# Patient Record
Sex: Male | Born: 1985 | Race: White | Hispanic: No | Marital: Single | Smoking: Never smoker
Health system: Southern US, Community
[De-identification: ages and names within clinical notes are randomized; demographics above are authoritative.]

## PROBLEM LIST (undated history)

## (undated) HISTORY — PX: KNEE SURGERY: SHX244

---
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
## 2005-01-23 ENCOUNTER — Emergency Department (HOSPITAL_COMMUNITY): Admission: EM | Admit: 2005-01-23 | Discharge: 2005-01-23 | Payer: Self-pay | Admitting: Emergency Medicine

## 2007-07-12 ENCOUNTER — Emergency Department (HOSPITAL_COMMUNITY): Admission: EM | Admit: 2007-07-12 | Discharge: 2007-07-12 | Payer: Self-pay | Admitting: Emergency Medicine

## 2011-01-21 LAB — I-STAT 8, (EC8 V) (CONVERTED LAB)
Acid-base deficit: 2
Chloride: 106
HCT: 46
Potassium: 3.6
pH, Ven: 7.361 — ABNORMAL HIGH

## 2011-01-21 LAB — CBC
HCT: 42.3
Hemoglobin: 14.5
MCV: 91.6
RBC: 4.62
WBC: 9.1

## 2011-01-21 LAB — DIFFERENTIAL
Eosinophils Absolute: 0.1
Eosinophils Relative: 1
Lymphs Abs: 2.7
Monocytes Absolute: 0.6
Monocytes Relative: 6
Neutrophils Relative %: 63

## 2011-01-21 LAB — POCT I-STAT CREATININE: Creatinine, Ser: 1.2

## 2016-11-12 ENCOUNTER — Ambulatory Visit (INDEPENDENT_AMBULATORY_CARE_PROVIDER_SITE_OTHER): Payer: Self-pay | Admitting: Physician Assistant

## 2016-11-12 ENCOUNTER — Encounter: Payer: Self-pay | Admitting: Physician Assistant

## 2016-11-12 VITALS — BP 138/81 | HR 67 | Temp 98.7°F | Resp 16 | Ht 73.5 in | Wt 258.0 lb

## 2016-11-12 DIAGNOSIS — M25521 Pain in right elbow: Secondary | ICD-10-CM

## 2016-11-12 DIAGNOSIS — R200 Anesthesia of skin: Secondary | ICD-10-CM

## 2016-11-12 DIAGNOSIS — R202 Paresthesia of skin: Secondary | ICD-10-CM

## 2016-11-12 DIAGNOSIS — M542 Cervicalgia: Secondary | ICD-10-CM

## 2016-11-12 MED ORDER — PREDNISONE 20 MG PO TABS: ORAL_TABLET | ORAL | 0 refills | Status: DC

## 2016-11-12 MED ORDER — CYCLOBENZAPRINE HCL 10 MG PO TABS: 10.0000 mg | ORAL_TABLET | Freq: Three times a day (TID) | ORAL | 0 refills | Status: AC | PRN

## 2016-11-12 MED ORDER — MELOXICAM 15 MG PO TABS: 15.0000 mg | ORAL_TABLET | Freq: Every day | ORAL | 1 refills | Status: AC
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
## 2016-11-12 NOTE — Patient Instructions (Addendum)
Con't using ice and heat as needed for pain.  Stay well hydrated.  Stretch your neck and get massages to relax your muscles.  Come back in 3-4 weeks if you are not better.   Thank you for coming in today. I hope you feel we met your needs.  Feel free to call [facility] if you have any questions or further requests.  Please consider signing up for MyChart if you do not already have it, as this is a great way to communicate with me.  Best,  Whitney McVey, PA-C   IF you received an x-ray today, you will receive an invoice from [facility]. Please contact [facility] at [phone] with questions or concerns regarding your invoice.   IF you received labwork today, you will receive an invoice from [place]. Please contact [facility] at [phone] with questions or concerns regarding your invoice.   Our billing staff will not be able to assist you with questions regarding bills from these companies.  You will be contacted with the lab results as soon as they are available. The fastest way to get your results is to activate your My Chart account. Instructions are located on the last page of this paperwork. If you have not heard from [place] regarding the results in 2 weeks, please contact this office.

## 2017-03-18 DIAGNOSIS — Z0000 Encounter for general adult medical examination without abnormal findings: Secondary | ICD-10-CM | POA: Diagnosis not present

## 2017-04-25 ENCOUNTER — Other Ambulatory Visit: Payer: Self-pay | Admitting: Internal Medicine

## 2017-04-25 DIAGNOSIS — M545 Low back pain, unspecified: Secondary | ICD-10-CM

## 2017-04-25 DIAGNOSIS — M5410 Radiculopathy, site unspecified: Secondary | ICD-10-CM

## 2017-05-01 ENCOUNTER — Ambulatory Visit
Admission: RE | Admit: 2017-05-01 | Discharge: 2017-05-01 | Disposition: A | Discharge status: Home / Self Care | Payer: BLUE CROSS/BLUE SHIELD | Source: Ambulatory Visit | Attending: Internal Medicine | Admitting: Internal Medicine

## 2017-05-01 DIAGNOSIS — M5410 Radiculopathy, site unspecified: Secondary | ICD-10-CM

## 2017-05-01 DIAGNOSIS — M545 Low back pain, unspecified: Secondary | ICD-10-CM

## 2017-05-01 DIAGNOSIS — M48061 Spinal stenosis, lumbar region without neurogenic claudication: Secondary | ICD-10-CM | POA: Diagnosis not present

## 2017-05-01 MED ORDER — GADOBENATE DIMEGLUMINE 529 MG/ML IV SOLN
20.0000 mL | Freq: Once | INTRAVENOUS | Status: AC | PRN
  Administered 2017-05-01: 20 mL via INTRAVENOUS

## 2017-05-05 ENCOUNTER — Other Ambulatory Visit: Payer: Self-pay

## 2017-05-05 DIAGNOSIS — M5416 Radiculopathy, lumbar region: Secondary | ICD-10-CM | POA: Diagnosis not present

## 2017-05-05 DIAGNOSIS — M549 Dorsalgia, unspecified: Secondary | ICD-10-CM | POA: Diagnosis not present

## 2017-05-05 DIAGNOSIS — M47816 Spondylosis without myelopathy or radiculopathy, lumbar region: Secondary | ICD-10-CM | POA: Diagnosis not present

## 2017-05-05 DIAGNOSIS — M546 Pain in thoracic spine: Secondary | ICD-10-CM | POA: Diagnosis not present

## 2017-05-05 DIAGNOSIS — M5126 Other intervertebral disc displacement, lumbar region: Secondary | ICD-10-CM | POA: Diagnosis not present

## 2017-05-05 DIAGNOSIS — M5136 Other intervertebral disc degeneration, lumbar region: Secondary | ICD-10-CM | POA: Diagnosis not present

## 2017-05-22 DIAGNOSIS — M5126 Other intervertebral disc displacement, lumbar region: Secondary | ICD-10-CM | POA: Diagnosis not present

## 2017-05-22 DIAGNOSIS — M5116 Intervertebral disc disorders with radiculopathy, lumbar region: Secondary | ICD-10-CM | POA: Diagnosis not present

## 2017-05-22 DIAGNOSIS — M5136 Other intervertebral disc degeneration, lumbar region: Secondary | ICD-10-CM | POA: Diagnosis not present

## 2017-05-22 DIAGNOSIS — M4726 Other spondylosis with radiculopathy, lumbar region: Secondary | ICD-10-CM | POA: Diagnosis not present

## 2018-05-11 DIAGNOSIS — Z3141 Encounter for fertility testing: Secondary | ICD-10-CM | POA: Diagnosis not present

## 2018-08-09 IMAGING — MR MR LUMBAR SPINE WO/W CM
7 series · 41 of 48 positions shown · IV contrast (20 ml multihance)
Comparison: None available.

CLINICAL DATA: Initial evaluation for low back pain with right
lower extremity pain, weakness, and numbness for 3 weeks.

EXAM:
MRI LUMBAR SPINE WITHOUT AND WITH CONTRAST
TECHNIQUE: Multiplanar and multiecho pulse sequences of the lumbar spine were
obtained without and with intravenous contrast.
CONTRAST:  20mL MULTIHANCE GADOBENATE DIMEGLUMINE 529 MG/ML IV SOLN

[Series 2: STIR · sagittal · 4.0mm · 0.55mm/px · 3 of 12 slices shown]
[im 1/12]
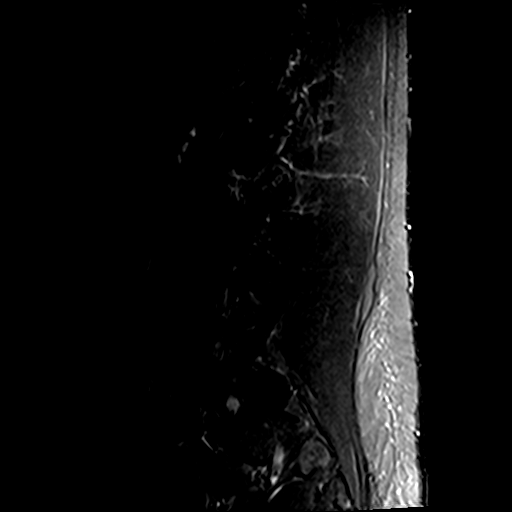
[im 6/12]
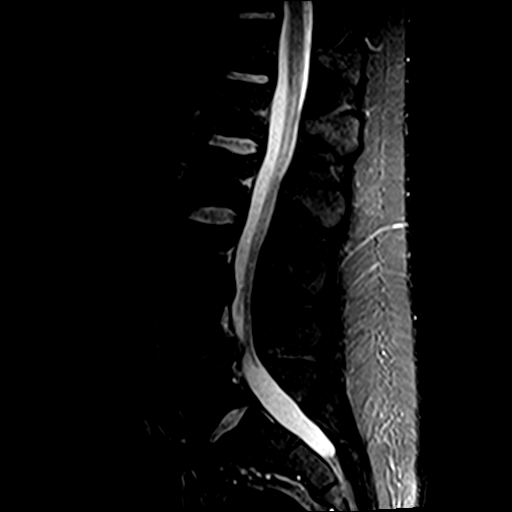
[im 12/12]
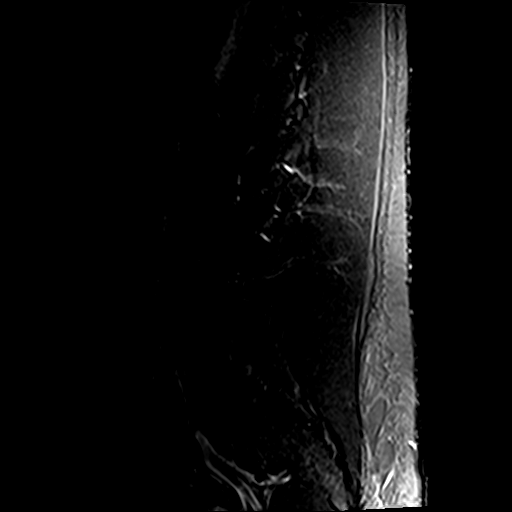

[Series 3: T1 · sagittal · 4.0mm · 0.88mm/px · 4 of 12 slices shown (1 of 2)]
[im 1/12]
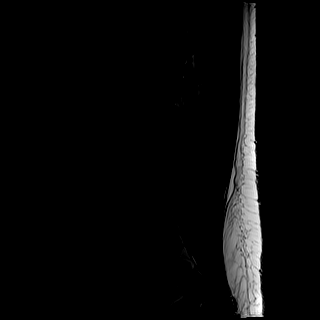
[im 4/12]
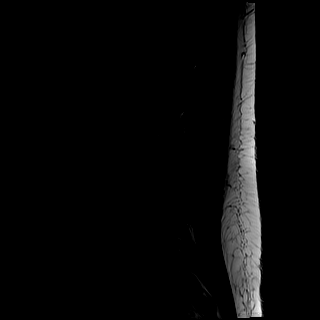
[im 8/12]
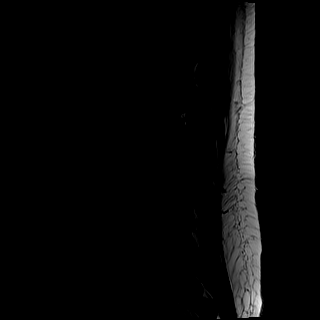
[im 12/12]
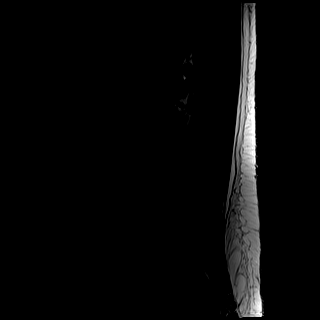

[Series 4: T1 · axial · 4.0mm · 0.78mm/px · z∈[-155,+82]mm · 11 of 33 slices shown (2 of 2)]
[im 1/33]
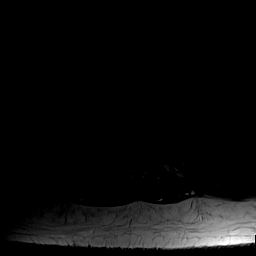
[im 4/33]
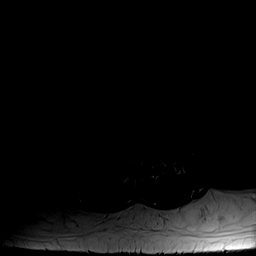
[im 7/33]
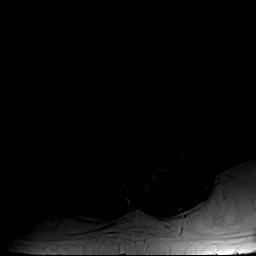
[im 10/33]
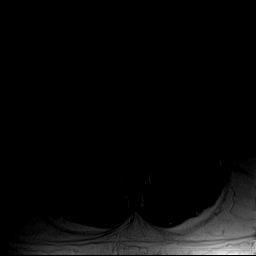
[im 13/33]
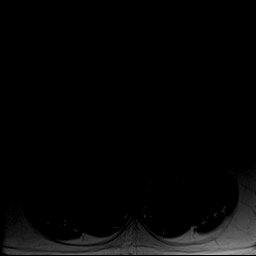
[im 17/33]
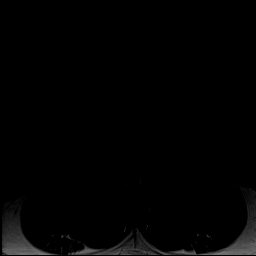
[im 20/33]
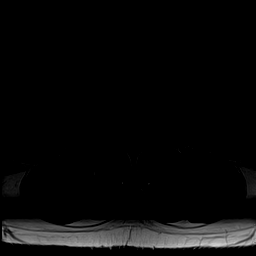
[im 23/33]
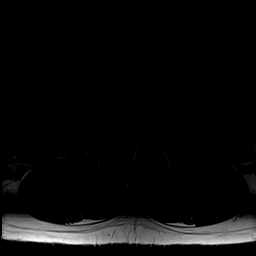
[im 26/33]
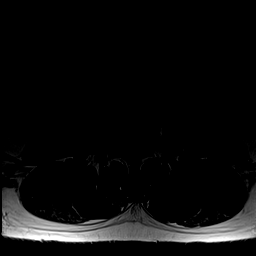
[im 29/33]
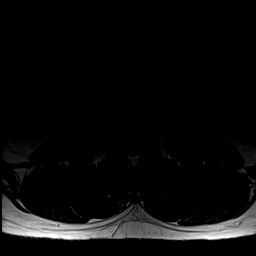
[im 33/33]
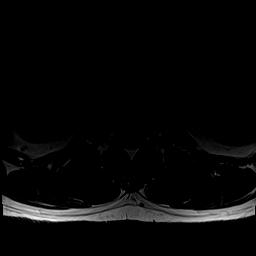

[Series 5: T2 · axial · 4.0mm · 0.78mm/px · z∈[-155,+82]mm · 11 of 33 slices shown]
[im 1/33]
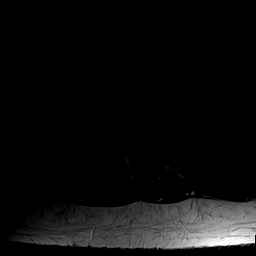
[im 4/33]
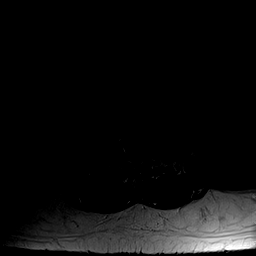
[im 7/33]
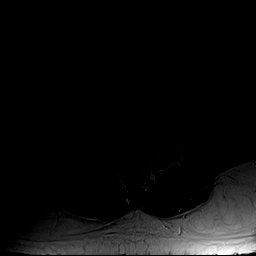
[im 10/33]
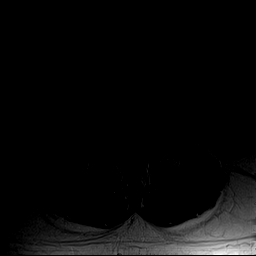
[im 13/33]
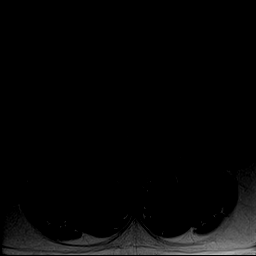
[im 17/33]
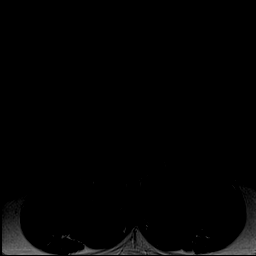
[im 20/33]
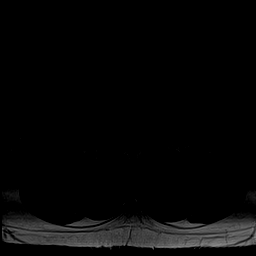
[im 23/33]
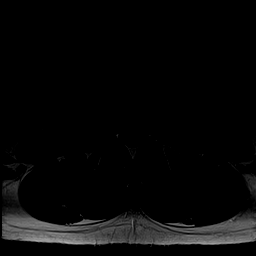
[im 26/33]
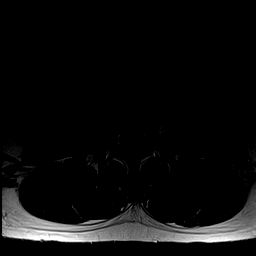
[im 29/33]
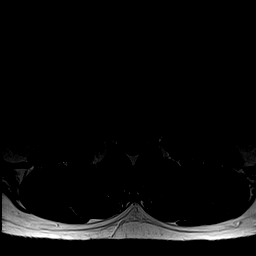
[im 33/33]
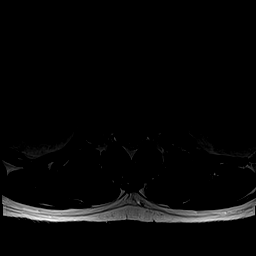

[Series 6: T2 post-contrast · sagittal · 4.0mm · 0.88mm/px · 4 of 12 slices shown]
[im 1/12]
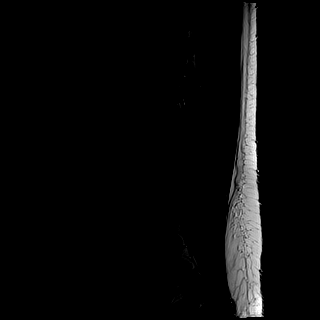
[im 4/12]
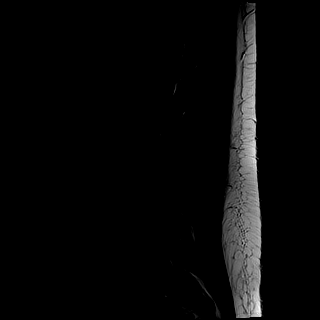
[im 8/12]
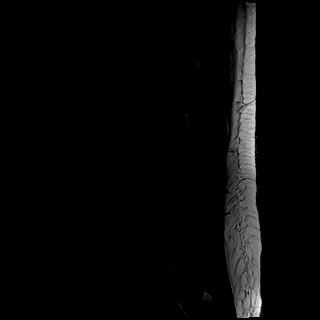
[im 12/12]
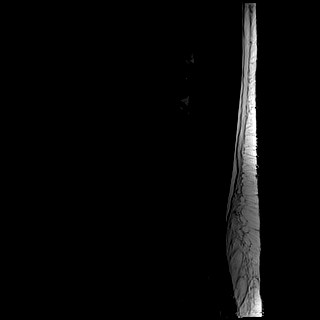

[Series 7: sag post · sagittal · 4.0mm · 0.55mm/px · 4 of 12 slices shown]
[im 1/12]
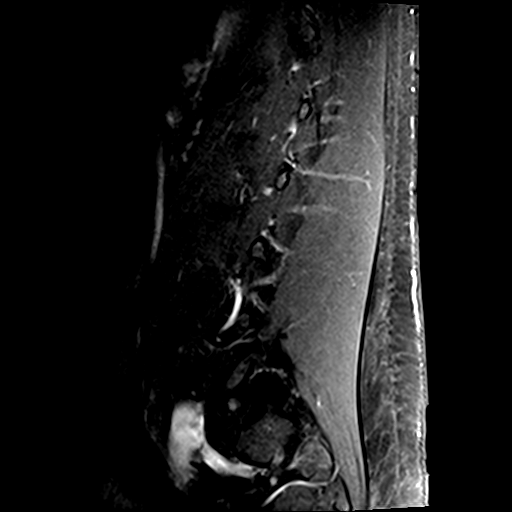
[im 4/12]
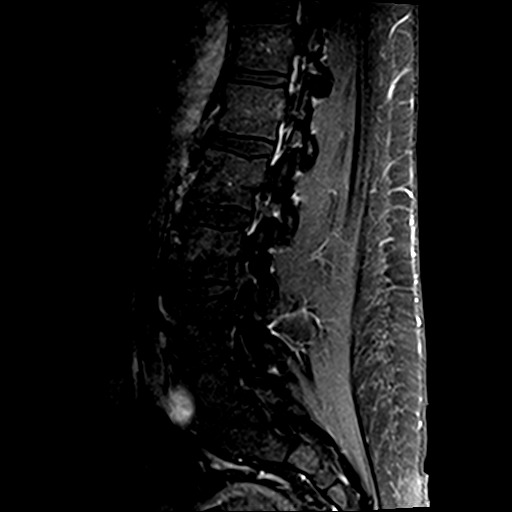
[im 8/12]
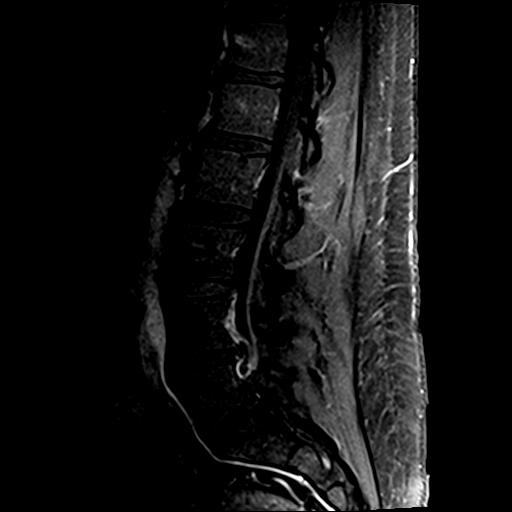
[im 12/12]
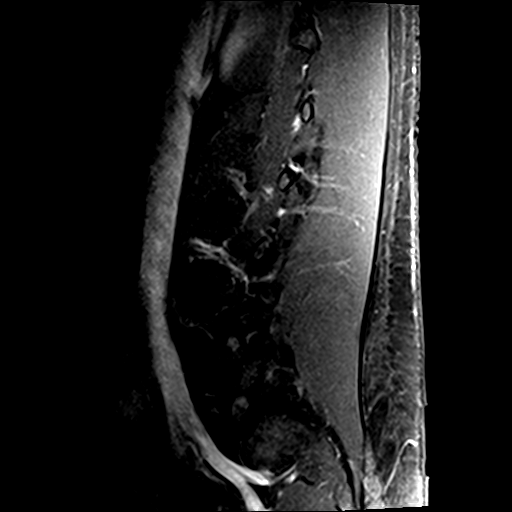

[Series 8: axial post · axial · 4.0mm · 0.78mm/px · z∈[-155,-95]mm · 4 of 33 slices shown]
[im 1/33]
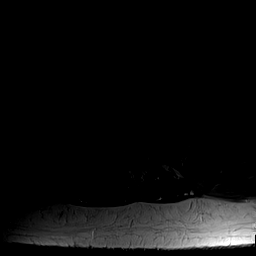
[im 7/33]
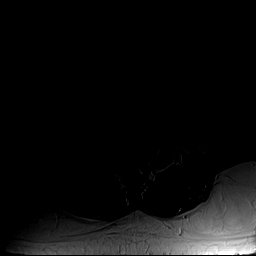
[im 10/33]
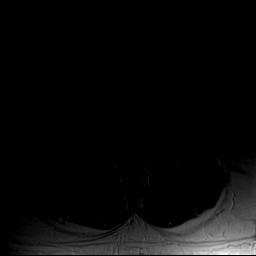
[im 13/33]
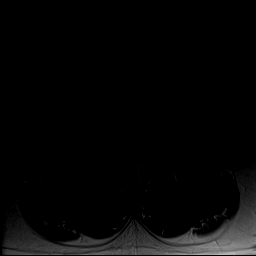

[41 of 48 positions shown; findings below may reference images not displayed]

FINDINGS: Segmentation: Normal segmentation. Lowest well-formed disc labeled
the L5-S1 level.

Alignment: Normal alignment with preservation of the normal lumbar
lordosis.

Vertebrae: Vertebral body heights maintained without acute or
chronic fracture. Bone marrow signal intensity mildly decreased on
T1 weighted imaging, most commonly related to anemia, smoking, or
obesity D. no discrete or worrisome osseous lesions. No abnormal
marrow edema or enhancement.

Conus medullaris and cauda equina: Conus extends to the L1 level.
Conus and cauda equina appear normal.

Paraspinal and other soft tissues: Paraspinous soft tissues within
normal limits. Visualized visceral structures are normal.

Disc levels:

T12-L1:  Unremarkable.

L1-2:  Unremarkable.

L2-3: Normal interspace. Mild bilateral facet hypertrophy. No
stenosis or impingement.

L3-4: Disc bulge with disc desiccation. Superimposed small central
disc protrusion with associated annular fissure. Mild facet
hypertrophy. Mild lateral recess narrowing without neural
compression. Foramina remain patent.

L4-5: Disc bulge with disc desiccation and intervertebral disc space
narrowing. Right subarticular disc extrusion measures approximately
8 x 14 x 19 mm (AP by transverse by craniocaudad). Approximate 1 cm
of inferior migration. Associated peridiscal enhancement.
Impingement of the right L5 nerve root. Associated nerve root
enhancement consistent with irritation related to impingement
(series 8, image 20 on axial sequence, series 7, image 6 on sagittal
sequence. Enhancement extends cephalad to the level of the conus.
Mild facet hypertrophy. Resultant moderate canal stenosis. Foramina
remain patent.

L5-S1: Normal interspace. Mild facet hypertrophy, slightly greater
on the right. Mild right L5 foraminal narrowing.
IMPRESSION: 1. Right subarticular disc extrusion at L4-5 with secondary right L5
nerve root impingement.
2. Central disc protrusion at L3-4 without neural compression.
3. Mild facet hypertrophy at L2-3 through L5-S1.

## 2019-02-12 DIAGNOSIS — Z23 Encounter for immunization: Secondary | ICD-10-CM | POA: Diagnosis not present

## 2019-03-22 DIAGNOSIS — Z0000 Encounter for general adult medical examination without abnormal findings: Secondary | ICD-10-CM | POA: Diagnosis not present

## 2019-03-31 DIAGNOSIS — Z1331 Encounter for screening for depression: Secondary | ICD-10-CM | POA: Diagnosis not present

## 2019-03-31 DIAGNOSIS — Z0000 Encounter for general adult medical examination without abnormal findings: Secondary | ICD-10-CM | POA: Diagnosis not present

## 2019-04-02 DIAGNOSIS — Z23 Encounter for immunization: Secondary | ICD-10-CM | POA: Diagnosis not present

## 2019-04-02 DIAGNOSIS — R7989 Other specified abnormal findings of blood chemistry: Secondary | ICD-10-CM | POA: Diagnosis not present

## 2019-04-26 DIAGNOSIS — M25512 Pain in left shoulder: Secondary | ICD-10-CM | POA: Diagnosis not present

## 2019-05-05 DIAGNOSIS — M25512 Pain in left shoulder: Secondary | ICD-10-CM | POA: Diagnosis not present

## 2019-05-10 DIAGNOSIS — M25512 Pain in left shoulder: Secondary | ICD-10-CM | POA: Diagnosis not present

## 2019-05-10 DIAGNOSIS — M7542 Impingement syndrome of left shoulder: Secondary | ICD-10-CM | POA: Diagnosis not present

## 2019-06-07 DIAGNOSIS — M25512 Pain in left shoulder: Secondary | ICD-10-CM | POA: Diagnosis not present

## 2019-06-07 DIAGNOSIS — M7542 Impingement syndrome of left shoulder: Secondary | ICD-10-CM | POA: Diagnosis not present

## 2019-07-08 ENCOUNTER — Ambulatory Visit: Payer: BC Managed Care – PPO | Attending: Internal Medicine

## 2019-07-08 DIAGNOSIS — Z23 Encounter for immunization: Secondary | ICD-10-CM

## 2019-07-08 NOTE — Progress Notes (Signed)
   Covid-19 Vaccination Clinic  Name:  Alex Hancock    MRN: [id number] DOB: [birth year]  07/08/2019  [name] was observed post Covid-19 immunization for 15 minutes without incident. He was provided with Vaccine Information Sheet and instruction to access the V-Safe system.   [name] was instructed to call 911 with any severe reactions post vaccine: Marland Kitchen Difficulty breathing  . Swelling of face and throat  . A fast heartbeat  . A bad rash all over body  . Dizziness and weakness   Immunizations Administered    Name Date Dose VIS Date Route   Pfizer COVID-19 Vaccine 07/08/2019 12:44 PM 0.3 mL 04/09/2019 Intramuscular   Manufacturer: [employer], [employer]   Lot: [id number]   NDC: [address]-8

## 2019-08-02 ENCOUNTER — Ambulatory Visit: Payer: BC Managed Care – PPO | Attending: Internal Medicine

## 2019-08-02 DIAGNOSIS — Z23 Encounter for immunization: Secondary | ICD-10-CM

## 2019-08-02 NOTE — Progress Notes (Signed)
   Covid-19 Vaccination Clinic  Name:  Alex Hancock    MRN: [id number] DOB: 06-Jun-1985  08/02/2019  Mr. Laymon was observed post Covid-19 immunization for 15 minutes without incident. He was provided with Vaccine Information Sheet and instruction to access the V-Safe system.   Mr. Dudzinski was instructed to call 911 with any severe reactions post vaccine: Marland Kitchen Difficulty breathing  . Swelling of face and throat  . A fast heartbeat  . A bad rash all over body  . Dizziness and weakness   Immunizations Administered    Name Date Dose VIS Date Route   Pfizer COVID-19 Vaccine 08/02/2019 10:17 AM 0.3 mL 04/09/2019 Intramuscular   Manufacturer: [employer], [employer]   Lot: [id number]   NDC: [address]-0

## 2019-10-06 DIAGNOSIS — Z3009 Encounter for other general counseling and advice on contraception: Secondary | ICD-10-CM | POA: Diagnosis not present

## 2019-10-21 DIAGNOSIS — Z302 Encounter for sterilization: Secondary | ICD-10-CM | POA: Diagnosis not present

## 2020-01-09 DIAGNOSIS — Z20822 Contact with and (suspected) exposure to covid-19: Secondary | ICD-10-CM | POA: Diagnosis not present

## 2020-10-31 ENCOUNTER — Encounter: Payer: Self-pay | Admitting: Physician Assistant

## 2020-10-31 ENCOUNTER — Telehealth: Payer: BC Managed Care – PPO | Admitting: Physician Assistant

## 2020-10-31 DIAGNOSIS — U071 COVID-19: Secondary | ICD-10-CM

## 2020-10-31 MED ORDER — MOLNUPIRAVIR EUA 200MG CAPSULE: 4.0000 | ORAL_CAPSULE | Freq: Two times a day (BID) | ORAL | 0 refills | Status: AC

## 2020-10-31 MED ORDER — MOLNUPIRAVIR EUA 200MG CAPSULE: 4.0000 | ORAL_CAPSULE | Freq: Two times a day (BID) | ORAL | 0 refills | Status: DC

## 2020-10-31 NOTE — Patient Instructions (Signed)
Hello Alex Hancock,  You are being placed in the home monitoring program for COVID-19 (commonly known as Coronavirus).  This is because you are suspected to have the virus or are known to have the virus.  If you are unsure which group you fall into call your clinic.    As part of this program, you'll answer a daily questionnaire in the MyChart mobile app. You'll receive a notification through the MyChart app when the questionnaire is available. When you log in to MyChart, you'll see the tasks in your To Do activity.       Clinicians will see any answers that are concerning and take appropriate steps.  If at any point you are having a medical emergency, call 911.  If otherwise concerned call your clinic instead of coming into the clinic or hospital.  To keep from spreading the disease you should: Stay home and limit contact with other people as much as possible.  Wash your hands frequently. Cover your coughs and sneezes with a tissue, and throw used tissues in the trash.   Clean and disinfect frequently touched surfaces and objects.    Take care of yourself by: Staying home Resting Drinking fluids Take fever-reducing medications (Tylenol/Acetaminophen and Ibuprofen)  For more information on the disease go to the Centers for Disease Control and Prevention website    You are being prescribed MOLNUPIRAVIR for COVID-19 infection.   Please call the pharmacy or go through the drive through vs going inside if you are picking up the mediation yourself to prevent further spread. If prescribed to a [facility] affiliated pharmacy, a pharmacist will bring the medication out to your car.   ADMINISTRATION INSTRUCTIONS: Take with or without food. Swallow the tablets whole. Don't chew, crush, or break the medications because it might not work as well  For each dose of the medication, you should be taking FOUR tablets at one time, TWICE a day   Finish your full five-day course of Molnupiravir even if you  feel better before you're done. Stopping this medication too early can make it less effective to prevent severe illness related to COVID19.    Molnupiravir is prescribed for YOU ONLY. Don't share it with others, even if they have similar symptoms as you. This medication might not be right for everyone.   Make sure to take steps to protect yourself and others while you're taking this medication in order to get well soon and to prevent others from getting sick with COVID-19.   **If you are of childbearing potential (any gender) - it is advised to not get pregnant while taking this medication and recommended that condoms are used for male partners the next 3 months after taking the medication out of extreme caution    COMMON SIDE EFFECTS: Diarrhea Nausea  Dizziness    If your COVID-19 symptoms get worse, get medical help right away. Call 911 if you experience symptoms such as worsening cough, trouble breathing, chest pain that doesn't go away, confusion, a hard time staying awake, and pale or blue-colored skin. This medication won't prevent all COVID-19 cases from getting worse.   Can take to lessen severity: Vit C 500mg  twice daily Quercertin 250-500mg  twice daily Zinc 75-100mg  daily Melatonin 3-6 mg at bedtime Vit D3 1000-2000 IU daily Aspirin 81 mg daily with food Optional: Famotidine 20mg  daily Also can add tylenol/ibuprofen as needed for fevers and body aches May add Mucinex or Mucinex DM as needed for cough/congestion  10 Things You Can Do to  Manage Your COVID-19 Symptoms at Home If you have possible or confirmed COVID-19 Stay home except to get medical care. Monitor your symptoms carefully. If your symptoms get worse, call your healthcare provider immediately. Get rest and stay hydrated. If you have a medical appointment, call the healthcare provider ahead of time and tell them that you have or may have COVID-19. For medical emergencies, call 911 and notify the dispatch  personnel that you have or may have COVID-19. Cover your cough and sneezes with a tissue or use the inside of your elbow. Wash your hands often with soap and water for at least 20 seconds or clean your hands with an alcohol-based hand sanitizer that contains at least 60% alcohol. As much as possible, stay in a specific room and away from other people in your home. Also, you should use a separate bathroom, if available. If you need to be around other people in or outside of the home, wear a mask. Avoid sharing personal items with other people in your household, like dishes, towels, and bedding. Clean all surfaces that are touched often, like counters, tabletops, and doorknobs. Use household cleaning sprays or wipes according to the label instructions. [email or web address] 11/12/2019 This information is not intended to replace advice given to you by your health care provider. Make sure you discuss any questions you have with your healthcare provider. Document Revised: 06/02/2020 Document Reviewed: 06/02/2020 Elsevier Patient Education  [place].

## 2020-10-31 NOTE — Addendum Note (Signed)
Addended by: Margaretann Loveless on: 10/31/2020 10:55 AM   Modules accepted: Orders

## 2020-10-31 NOTE — Progress Notes (Signed)
[name], perrell are scheduled for a virtual visit with your provider today.    Just as we do with appointments in the office, we must obtain your consent to participate.  Your consent will be active for this visit and any virtual visit you may have with one of our providers in the next 365 days.    If you have a MyChart account, I can also send a copy of this consent to you electronically.  All virtual visits are billed to your insurance company just like a traditional visit in the office.  As this is a virtual visit, video technology does not allow for your provider to perform a traditional examination.  This may limit your provider's ability to fully assess your condition.  If your provider identifies any concerns that need to be evaluated in person or the need to arrange testing such as labs, EKG, etc, we will make arrangements to do so.    Although advances in technology are sophisticated, we cannot ensure that it will always work on either your end or our end.  If the connection with a video visit is poor, we may have to switch to a telephone visit.  With either a video or telephone visit, we are not always able to ensure that we have a secure connection.   I need to obtain your verbal consent now.   Are you willing to proceed with your visit today?   Alex Hancock has provided verbal consent on 10/31/2020 for a virtual visit (video or telephone).   Alex Loveless, PA-C 10/31/2020  9:53 AM  Virtual Visit Consent   Alex Hancock, you are scheduled for a virtual visit with a [facility] provider today.     Just as with appointments in the office, your consent must be obtained to participate.  Your consent will be active for this visit and any virtual visit you may have with one of our providers in the next 365 days.     If you have a MyChart account, a copy of this consent can be sent to you electronically.  All virtual visits are billed to your insurance company just like a traditional  visit in the office.    As this is a virtual visit, video technology does not allow for your provider to perform a traditional examination.  This may limit your provider's ability to fully assess your condition.  If your provider identifies any concerns that need to be evaluated in person or the need to arrange testing (such as labs, EKG, etc.), we will make arrangements to do so.     Although advances in technology are sophisticated, we cannot ensure that it will always work on either your end or our end.  If the connection with a video visit is poor, the visit may have to be switched to a telephone visit.  With either a video or telephone visit, we are not always able to ensure that we have a secure connection.     I need to obtain your verbal consent now.   Are you willing to proceed with your visit today?    Alex Hancock has provided verbal consent on 10/31/2020 for a virtual visit (video or telephone).   Alex Loveless, PA-C   Date: 10/31/2020 9:53 AM   Virtual Visit via Video Note   I, Alex Hancock, connected with  Alex Hancock  ([id number], 1985-11-08) on 10/31/20 at  9:45 AM EDT by a video-enabled telemedicine application  and verified that I am speaking with the correct person using two identifiers.  Location: Patient: Virtual Visit Location Patient: Home Provider: Virtual Visit Location Provider: Home Office   I discussed the limitations of evaluation and management by telemedicine and the availability of in person appointments. The patient expressed understanding and agreed to proceed.    History of Present Illness: Alex Hancock is a 35 y.o. who identifies as a male who was assigned male at birth, and is being seen today for COVID 49.  HPI: URI  This is a new problem. The current episode started in the past 7 days (saturday symptoms started). The problem has been unchanged. The maximum temperature recorded prior to his arrival was 101 - 101.9 F. The fever has  been present for Less than 1 day. Associated symptoms include chest pain, congestion, coughing, diarrhea, headaches, nausea (no appetite), rhinorrhea, sinus pain and a sore throat (first symptom). Pertinent negatives include no vomiting. Associated symptoms comments: Chills, body aches, fatigue. He has tried acetaminophen (Mucinex) for the symptoms. The treatment provided mild relief.     Problems: There are no problems to display for this patient.   Allergies: No Known Allergies Medications:  Current Outpatient Medications:    molnupiravir EUA 200 mg CAPS, Take 4 capsules (800 mg total) by mouth 2 (two) times daily for 5 days., Disp: 40 capsule, Rfl: 0   cyclobenzaprine (FLEXERIL) 10 MG tablet, Take 1 tablet (10 mg total) by mouth 3 (three) times daily as needed for muscle spasms., Disp: 30 tablet, Rfl: 0   meloxicam (MOBIC) 15 MG tablet, Take 1 tablet (15 mg total) by mouth daily., Disp: 30 tablet, Rfl: 1   predniSONE (DELTASONE) 20 MG tablet, Take 3 PO QAM x3days, 2 PO QAM x3days, 1 PO QAM x3days, Disp: 18 tablet, Rfl: 0  Observations/Objective: Patient is well-developed, well-nourished in no acute distress.  Resting comfortably at home.  Head is normocephalic, atraumatic.  No labored breathing. Speech is clear and coherent with logical content.  Patient is alert and oriented at baseline.    Assessment and Plan: 1. COVID-19 - MyChart COVID-19 home monitoring program; Future - molnupiravir EUA 200 mg CAPS; Take 4 capsules (800 mg total) by mouth 2 (two) times daily for 5 days.  Dispense: 40 capsule; Refill: 0 - Temperature monitoring; Future - Continue OTC symptomatic management of choice - Will send OTC vitamins and supplement information through AVS - molnupiravir prescribed for patient  - Patient enrolled in MyChart symptom monitoring - Push fluids - Rest as needed - Discussed return precautions and when to seek in-person evaluation, sent via AVS as well  Follow Up  Instructions: I discussed the assessment and treatment plan with the patient. The patient was provided an opportunity to ask questions and all were answered. The patient agreed with the plan and demonstrated an understanding of the instructions.  A copy of instructions were sent to the patient via MyChart.  The patient was advised to call back or seek an in-person evaluation if the symptoms worsen or if the condition fails to improve as anticipated.  Time:  I spent 12 minutes with the patient via telehealth technology discussing the above problems/concerns.    Alex Loveless, PA-C

## 2020-11-01 ENCOUNTER — Telehealth: Payer: BC Managed Care – PPO | Admitting: Physician Assistant

## 2020-11-01 DIAGNOSIS — H1033 Unspecified acute conjunctivitis, bilateral: Secondary | ICD-10-CM | POA: Diagnosis not present

## 2020-11-01 DIAGNOSIS — J40 Bronchitis, not specified as acute or chronic: Secondary | ICD-10-CM | POA: Diagnosis not present

## 2020-11-01 MED ORDER — DOXYCYCLINE HYCLATE 100 MG PO TABS: 100.0000 mg | ORAL_TABLET | Freq: Two times a day (BID) | ORAL | 0 refills | Status: AC

## 2020-11-01 MED ORDER — POLYMYXIN B-TRIMETHOPRIM 10000-0.1 UNIT/ML-% OP SOLN: OPHTHALMIC | 0 refills | Status: AC

## 2020-11-01 NOTE — Progress Notes (Signed)
Virtual Visit Consent   Alex Hancock, you are scheduled for a virtual visit with a [facility] provider today.     Just as with appointments in the office, your consent must be obtained to participate.  Your consent will be active for this visit and any virtual visit you may have with one of our providers in the next 365 days.     If you have a MyChart account, a copy of this consent Hancock be sent to you electronically.  All virtual visits are billed to your insurance company just like a traditional visit in the office.    As this is a virtual visit, video technology does not allow for your provider to perform a traditional examination.  This may limit your provider's ability to fully assess your condition.  If your provider identifies any concerns that need to be evaluated in person or the need to arrange testing (such as labs, EKG, etc.), we will make arrangements to do so.     Although advances in technology are sophisticated, we cannot ensure that it will always work on either your end or our end.  If the connection with a video visit is poor, the visit may have to be switched to a telephone visit.  With either a video or telephone visit, we are not always able to ensure that we have a secure connection.     I need to obtain your verbal consent now.   Are you willing to proceed with your visit today?    Alex Hancock has provided verbal consent on 11/01/2020 for a virtual visit (video or telephone).   Piedad Climes, [place]   Date: 11/01/2020 8:21 AM   Virtual Visit via Video Note   I, Piedad Climes, connected with  Alex Hancock  ([id number], 01-Mar-1986) on 11/01/20 at  8:00 AM EDT by a video-enabled telemedicine application and verified that I am speaking with the correct person using two identifiers.  Location: Patient: Virtual Visit Location Patient: Home Provider: Virtual Visit Location Provider: Home Office   I discussed the limitations of evaluation and management  by telemedicine and the availability of in person appointments. The patient expressed understanding and agreed to proceed.    History of Present Illness: Alex Hancock is a 35 y.o. who identifies as a male who was assigned male at birth, and is being seen today for new URI symptoms and possible conjunctivitis. Patient was evaluated via video visit yesterday for URI symptoms and postivie COVID test results. Was started on molnupiravir for COVID-19 which he has picked up and will be starting this morning. Notes yesterday afternoon to this morning developing a more productive cough (previously dry) with thick green-brown sputum. Denies any recurrence of fever. Denies chest pain or SOB. Also noted the development of red eye bilaterally with purulent drainage and eyes matted shut on waking up. Has a daughter that just finished treatment for pink eye and is concerned of having contracted that as well.  HPI: HPI  Problems: There are no problems to display for this patient.   Allergies: No Known Allergies Medications:  Current Outpatient Medications:    doxycycline (VIBRA-TABS) 100 MG tablet, Take 1 tablet (100 mg total) by mouth 2 (two) times daily., Disp: 14 tablet, Rfl: 0   trimethoprim-polymyxin b (POLYTRIM) ophthalmic solution, Apply 1-2 drops into affected eye QID x 5 days., Disp: 10 mL, Rfl: 0   cyclobenzaprine (FLEXERIL) 10 MG tablet, Take 1 tablet (10 mg total) by  mouth 3 (three) times daily as needed for muscle spasms., Disp: 30 tablet, Rfl: 0   meloxicam (MOBIC) 15 MG tablet, Take 1 tablet (15 mg total) by mouth daily., Disp: 30 tablet, Rfl: 1   molnupiravir EUA 200 mg CAPS, Take 4 capsules (800 mg total) by mouth 2 (two) times daily for 5 days., Disp: 40 capsule, Rfl: 0  Observations/Objective: Patient is well-developed, well-nourished in no acute distress.  Resting comfortably at home.  Head is normocephalic, atraumatic.  No labored breathing. Speech is clear and coherent with logical  content.  Patient is alert and oriented at baseline.  Bilateral conjunctival injection noted with drainage. EOMI. No sign of subconjunctival hemorrhage or periorbital swelling.  Assessment and Plan: 1. Acute conjunctivitis of both eyes, unspecified acute conjunctivitis type Discussed conjunctivitis Hancock be viral and with COVID illness presently that Hancock certainly be the culprit. However giving known exposure to someone with bacterial conjunctivitis, we should treat for that to cover our bases. Supportive measures reviewed with patient. Start Polytrim OP.  - trimethoprim-polymyxin b (POLYTRIM) ophthalmic solution; Apply 1-2 drops into affected eye QID x 5 days.  Dispense: 10 mL; Refill: 0  2. Bronchitis Concern for secondary bronchitis giving increase in cough and sputum production with color change. Will add on Doxycycline to current regimen. Continue Mucinex and care discussed at his visit with video provider yesterday.  - doxycycline (VIBRA-TABS) 100 MG tablet; Take 1 tablet (100 mg total) by mouth 2 (two) times daily.  Dispense: 14 tablet; Refill: 0   Follow Up Instructions: I discussed the assessment and treatment plan with the patient. The patient was provided an opportunity to ask questions and all were answered. The patient agreed with the plan and demonstrated an understanding of the instructions.  A copy of instructions were sent to the patient via MyChart.  The patient was advised to call back or seek an in-person evaluation if the symptoms worsen or if the condition fails to improve as anticipated.  Time:  I spent 15 minutes with the patient via telehealth technology discussing the above problems/concerns.    Piedad Climes, PA-C

## 2020-11-01 NOTE — Patient Instructions (Signed)
  Alex Hancock, thank you for joining Piedad Climes, PA-C for today's virtual visit.  While this provider is not your primary care provider (PCP), if your PCP is located in our provider database this encounter information will be shared with them immediately following your visit.  Consent: (Patient) Alex Hancock provided verbal consent for this virtual visit at the beginning of the encounter.  Current Medications:  Current Outpatient Medications:    doxycycline (VIBRA-TABS) 100 MG tablet, Take 1 tablet (100 mg total) by mouth 2 (two) times daily., Disp: 14 tablet, Rfl: 0   trimethoprim-polymyxin b (POLYTRIM) ophthalmic solution, Apply 1-2 drops into affected eye QID x 5 days., Disp: 10 mL, Rfl: 0   cyclobenzaprine (FLEXERIL) 10 MG tablet, Take 1 tablet (10 mg total) by mouth 3 (three) times daily as needed for muscle spasms., Disp: 30 tablet, Rfl: 0   meloxicam (MOBIC) 15 MG tablet, Take 1 tablet (15 mg total) by mouth daily., Disp: 30 tablet, Rfl: 1   molnupiravir EUA 200 mg CAPS, Take 4 capsules (800 mg total) by mouth 2 (two) times daily for 5 days., Disp: 40 capsule, Rfl: 0   Medications ordered in this encounter:  Meds ordered this encounter  Medications   trimethoprim-polymyxin b (POLYTRIM) ophthalmic solution    Sig: Apply 1-2 drops into affected eye QID x 5 days.    Dispense:  10 mL    Refill:  0    Order Specific Question:   Supervising Provider    Answer:   MILLER, BRIAN [[id number]]   doxycycline (VIBRA-TABS) 100 MG tablet    Sig: Take 1 tablet (100 mg total) by mouth 2 (two) times daily.    Dispense:  14 tablet    Refill:  0    Order Specific Question:   Supervising Provider    Answer:   Hyacinth Meeker, BRIAN [[id number]]     *If you need refills on other medications prior to your next appointment, please contact your pharmacy*  Follow-Up: Call back or seek an in-person evaluation if the symptoms worsen or if the condition fails to improve as anticipated.  Other  Instructions Please continue care discussed at yesterday's visit, adding on the new antibiotic (Doxycycline) and eye drop (Polytrim) as directed to help with bronchitis and the conjunctivitis. Make sure to use a warm compress over the eyes for about 10-15 minutes, a few times per day. If symptoms are not resolving, anything worsens or new symptoms develop, you will need an in-office evaluation.   If you have been instructed to have an in-person evaluation today at a local Urgent Care facility, please use the link below. It will take you to a list of all of our available [facility], including address, phone number and hours of operation. Please do not delay care.  [facility]  If you or a family member do not have a primary care provider, use the link below to schedule a visit and establish care. When you choose a [facility] primary care physician or advanced practice provider, you gain a long-term partner in health. Find a Primary Care Provider  Learn more about [facility]'s in-office and virtual care options: [facility] - Get Care Now

## 2022-09-16 DIAGNOSIS — Z125 Encounter for screening for malignant neoplasm of prostate: Secondary | ICD-10-CM | POA: Diagnosis not present

## 2022-09-16 DIAGNOSIS — Z0000 Encounter for general adult medical examination without abnormal findings: Secondary | ICD-10-CM | POA: Diagnosis not present

## 2022-09-16 DIAGNOSIS — Z1331 Encounter for screening for depression: Secondary | ICD-10-CM | POA: Diagnosis not present

## 2022-09-16 DIAGNOSIS — R82998 Other abnormal findings in urine: Secondary | ICD-10-CM | POA: Diagnosis not present

## 2022-09-16 DIAGNOSIS — R7989 Other specified abnormal findings of blood chemistry: Secondary | ICD-10-CM | POA: Diagnosis not present

## 2024-02-16 DIAGNOSIS — M79672 Pain in left foot: Secondary | ICD-10-CM | POA: Diagnosis not present
